# Patient Record
Sex: Female | Born: 1990 | Race: Black or African American | Hispanic: No | Marital: Single | Smoking: Never smoker
Health system: Southern US, Community
[De-identification: ages and names within clinical notes are randomized; demographics above are authoritative.]

---
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
## 2013-05-23 ENCOUNTER — Other Ambulatory Visit: Payer: Self-pay | Admitting: Family Medicine

## 2013-05-23 ENCOUNTER — Ambulatory Visit: Payer: Self-pay | Admitting: Family Medicine

## 2013-05-23 ENCOUNTER — Telehealth: Payer: Self-pay | Admitting: Radiology

## 2013-05-23 VITALS — BP 108/64 | HR 57 | Temp 98.6°F | Resp 16 | Ht 61.5 in | Wt 169.0 lb

## 2013-05-23 DIAGNOSIS — N6320 Unspecified lump in the left breast, unspecified quadrant: Secondary | ICD-10-CM

## 2013-05-23 DIAGNOSIS — N63 Unspecified lump in unspecified breast: Secondary | ICD-10-CM

## 2013-05-23 DIAGNOSIS — R928 Other abnormal and inconclusive findings on diagnostic imaging of breast: Secondary | ICD-10-CM

## 2013-05-23 NOTE — Progress Notes (Signed)
Patient ID: Yvonne Castro, female   DOB: 12-24-1990, 22 y.o.   MRN: [id number]  @[facility] @  Patient ID: Yvonne Castro MRN: [id number], DOB: 08-Sep-1990, 22 y.o. Date of Encounter: 05/23/2013, 11:41 AM This chart was scribed for Elvina Sidle, MD by Valera Castle, ED Scribe. This patient was seen in room 8 and the patient's care was started at 11:41 AM.  Primary Physician: No primary provider on file.  Chief Complaint: Breast Biopsy referral  HPI: 22 y.o. year old female with history below presents for a breast biopsy referral. She reports her doctor told her she needed a referral before she could have the biopsy performed. She reports first being diagnosed with a breast lump, fibroadenoma, during a military exam on 05/02/2013, and that she needs to have the biopsy performed before she can continue with the [employer]. She denies breast cancer running in her family. She denies any other complaints.  She states she went to [employer].   No past medical history on file.   Home Meds: Prior to Admission medications   Not on File    Allergies: No Known Allergies  History   Social History  . Marital Status: Single    Spouse Name: N/A    Number of Children: N/A  . Years of Education: N/A   Occupational History  . Not on file.   Social History Main Topics  . Smoking status: Never Smoker   . Smokeless tobacco: Not on file  . Alcohol Use: No  . Drug Use: No  . Sexual Activity: Not on file   Other Topics Concern  . Not on file   Social History Narrative  . No narrative on file     Review of Systems: Constitutional: negative for chills, fever, night sweats, weight changes, or fatigue  HEENT: negative for vision changes, hearing loss, congestion, rhinorrhea, ST, epistaxis, or sinus pressure Cardiovascular: negative for chest pain or palpitations Respiratory: negative for hemoptysis, wheezing, shortness of breath, or cough Abdominal: negative for abdominal pain, nausea, vomiting,  diarrhea, or constipation Dermatological: negative for rash  Positive for lump over right breast Neurologic: negative for headache, dizziness, or syncope All other systems reviewed and are otherwise negative with the exception to those above and in the HPI.   Physical Exam: Blood pressure 108/64, pulse 57, temperature 98.6 F (37 C), temperature source Oral, resp. rate 16, height 5' 1.5" (1.562 m), weight 169 lb (76.658 kg), last menstrual period 05/01/2013, SpO2 100.00%., Body mass index is 31.42 kg/(m^2). General: Well developed, well nourished, in no acute distress. Head: Normocephalic, atraumatic, eyes without discharge, sclera non-icteric, nares are without discharge. Bilateral auditory canals clear, TM's are without perforation, pearly grey and translucent with reflective cone of light bilaterally. Oral cavity moist, posterior pharynx without exudate, erythema, peritonsillar abscess, or post nasal drip.  Neck: Supple. No thyromegaly. Full ROM. No lymphadenopathy. Lungs: Clear bilaterally to auscultation without wheezes, rales, or rhonchi. Breathing is unlabored. Heart: RRR with S1 S2. No murmurs, rubs, or gallops appreciated. Abdomen: Soft, non-tender, non-distended with normoactive bowel sounds. No hepatomegaly. No rebound/guarding. No obvious abdominal masses. Msk:  Strength and tone normal for age. Extremities/Skin: Warm and dry. No clubbing or cyanosis. No edema. No rashes or suspicious lesions.  Neuro: Alert and oriented X 3. Moves all extremities spontaneously. Gait is normal. CNII-XII grossly in tact. Psych:  Responds to questions appropriately with a normal affect.    ASSESSMENT AND PLAN:  22 y.o. 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 The recorded information has been reviewed and is accurate.

## 2013-05-23 NOTE — Telephone Encounter (Signed)
Patient went with written order for this, it must be in Epic, have put this in for you, please ask for help in the future, I will be happy to help. Amy The breast center is in epic, all there orders must be in computer.

## 2013-05-23 NOTE — Telephone Encounter (Signed)
Call from the breast center, patient has written order for a biopsy. Unsure why, have transferred to [facility], Production designer, theatre/television/film.

## 2013-05-25 NOTE — Telephone Encounter (Signed)
Put in order correctly. Will discuss with French Ana. We can not send patients with written Rx to breast center.

## 2013-05-26 ENCOUNTER — Ambulatory Visit
Admission: RE | Admit: 2013-05-26 | Discharge: 2013-05-26 | Disposition: A | Discharge status: Home / Self Care | Payer: Medicaid Other | Source: Ambulatory Visit | Attending: Family Medicine | Admitting: Family Medicine

## 2013-05-26 ENCOUNTER — Other Ambulatory Visit: Payer: Self-pay | Admitting: Family Medicine

## 2013-05-26 DIAGNOSIS — N6320 Unspecified lump in the left breast, unspecified quadrant: Secondary | ICD-10-CM

## 2013-08-10 ENCOUNTER — Encounter (HOSPITAL_COMMUNITY): Payer: Self-pay | Admitting: Emergency Medicine

## 2013-08-10 ENCOUNTER — Encounter (HOSPITAL_COMMUNITY): Admission: EM | Disposition: A | Discharge status: Home / Self Care | Payer: Self-pay | Source: Home / Self Care

## 2013-08-10 ENCOUNTER — Emergency Department (INDEPENDENT_AMBULATORY_CARE_PROVIDER_SITE_OTHER)
Admission: EM | Admit: 2013-08-10 | Discharge: 2013-08-10 | Disposition: A | Discharge status: Home / Self Care | Payer: Self-pay | Source: Home / Self Care | Attending: Emergency Medicine | Admitting: Emergency Medicine

## 2013-08-10 ENCOUNTER — Emergency Department (INDEPENDENT_AMBULATORY_CARE_PROVIDER_SITE_OTHER): Payer: Self-pay

## 2013-08-10 ENCOUNTER — Emergency Department (HOSPITAL_COMMUNITY): Payer: Medicaid Other

## 2013-08-10 ENCOUNTER — Ambulatory Visit (HOSPITAL_COMMUNITY)
Admission: EM | Admit: 2013-08-10 | Discharge: 2013-08-11 | Disposition: A | Discharge status: Home / Self Care | Payer: Medicaid Other | Attending: Orthopedic Surgery | Admitting: Orthopedic Surgery

## 2013-08-10 ENCOUNTER — Emergency Department (HOSPITAL_COMMUNITY): Payer: Medicaid Other | Admitting: Anesthesiology

## 2013-08-10 ENCOUNTER — Encounter (HOSPITAL_COMMUNITY): Payer: Medicaid Other | Admitting: Anesthesiology

## 2013-08-10 DIAGNOSIS — S52609A Unspecified fracture of lower end of unspecified ulna, initial encounter for closed fracture: Principal | ICD-10-CM

## 2013-08-10 DIAGNOSIS — S52501A Unspecified fracture of the lower end of right radius, initial encounter for closed fracture: Secondary | ICD-10-CM | POA: Diagnosis present

## 2013-08-10 DIAGNOSIS — W19XXXA Unspecified fall, initial encounter: Secondary | ICD-10-CM | POA: Insufficient documentation

## 2013-08-10 DIAGNOSIS — S52509A Unspecified fracture of the lower end of unspecified radius, initial encounter for closed fracture: Secondary | ICD-10-CM

## 2013-08-10 DIAGNOSIS — S52309A Unspecified fracture of shaft of unspecified radius, initial encounter for closed fracture: Secondary | ICD-10-CM | POA: Insufficient documentation

## 2013-08-10 DIAGNOSIS — Y9361 Activity, american tackle football: Secondary | ICD-10-CM | POA: Insufficient documentation

## 2013-08-10 HISTORY — PX: ORIF WRIST FRACTURE: SHX2133

## 2013-08-10 LAB — CBC
HCT: 34.6 % — ABNORMAL LOW (ref 36.0–46.0)
HEMOGLOBIN: 11.2 g/dL — AB (ref 12.0–15.0)
MCH: 26.5 pg (ref 26.0–34.0)
MCHC: 32.4 g/dL (ref 30.0–36.0)
MCV: 81.8 fL (ref 78.0–100.0)
Platelets: 291 10*3/uL (ref 150–400)
RBC: 4.23 MIL/uL (ref 3.87–5.11)
RDW: 15.2 % (ref 11.5–15.5)
WBC: 12.2 10*3/uL — ABNORMAL HIGH (ref 4.0–10.5)

## 2013-08-10 LAB — BASIC METABOLIC PANEL
BUN: 12 mg/dL (ref 6–23)
CALCIUM: 9.3 mg/dL (ref 8.4–10.5)
CO2: 24 mEq/L (ref 19–32)
Chloride: 104 mEq/L (ref 96–112)
Creatinine, Ser: 0.62 mg/dL (ref 0.50–1.10)
GFR calc Af Amer: >90 mL/min (ref >90)
Glucose, Bld: 82 mg/dL (ref 70–99)
Potassium: 3.9 mEq/L (ref 3.7–5.3)
Sodium: 140 mEq/L (ref 137–147)

## 2013-08-10 LAB — POCT PREGNANCY, URINE: Preg Test, Ur: NEGATIVE

## 2013-08-10 SURGERY — OPEN REDUCTION INTERNAL FIXATION (ORIF) WRIST FRACTURE
Anesthesia: General | Site: Wrist | Laterality: Right

## 2013-08-10 MED ORDER — CEFAZOLIN SODIUM 1 G IJ SOLR
2.0000 g | INTRAMUSCULAR | Status: AC
  Administered 2013-08-10: 2 g via INTRAVENOUS
  Filled 2013-08-10 (×2): qty 20

## 2013-08-10 MED ORDER — LIDOCAINE HCL (CARDIAC) 20 MG/ML IV SOLN
INTRAVENOUS | Status: DC | PRN
  Administered 2013-08-10: 80 mg via INTRAVENOUS

## 2013-08-10 MED ORDER — ADULT MULTIVITAMIN W/MINERALS CH
1.0000 | ORAL_TABLET | Freq: Every day | ORAL | Status: DC
  Administered 2013-08-11: 1 via ORAL
  Filled 2013-08-10: qty 1

## 2013-08-10 MED ORDER — DOCUSATE SODIUM 100 MG PO CAPS: 100.0000 mg | ORAL_CAPSULE | Freq: Two times a day (BID) | ORAL | Status: AC

## 2013-08-10 MED ORDER — METHOCARBAMOL 500 MG PO TABS
ORAL_TABLET | ORAL | Status: AC
  Administered 2013-08-10: 500 mg via ORAL
  Filled 2013-08-10: qty 1

## 2013-08-10 MED ORDER — HYDROMORPHONE HCL PF 1 MG/ML IJ SOLN
INTRAMUSCULAR | Status: AC
  Administered 2013-08-10: 0.5 mg via INTRAVENOUS
  Filled 2013-08-10: qty 1

## 2013-08-10 MED ORDER — BUPIVACAINE HCL (PF) 0.25 % IJ SOLN
INTRAMUSCULAR | Status: AC
  Filled 2013-08-10: qty 30

## 2013-08-10 MED ORDER — HYDROMORPHONE HCL PF 1 MG/ML IJ SOLN
0.2500 mg | INTRAMUSCULAR | Status: DC | PRN
  Administered 2013-08-10 (×2): 0.5 mg via INTRAVENOUS

## 2013-08-10 MED ORDER — DEXAMETHASONE SODIUM PHOSPHATE 4 MG/ML IJ SOLN
INTRAMUSCULAR | Status: AC
  Filled 2013-08-10: qty 1

## 2013-08-10 MED ORDER — CEFAZOLIN SODIUM 1-5 GM-% IV SOLN
1.0000 g | INTRAVENOUS | Status: AC
  Administered 2013-08-10: 1 g via INTRAVENOUS
  Filled 2013-08-10: qty 50

## 2013-08-10 MED ORDER — OXYCODONE HCL 5 MG/5ML PO SOLN: 5.0000 mg | Freq: Once | ORAL | Status: DC | PRN

## 2013-08-10 MED ORDER — ALPRAZOLAM 0.5 MG PO TABS: 0.5000 mg | ORAL_TABLET | Freq: Four times a day (QID) | ORAL | Status: DC | PRN

## 2013-08-10 MED ORDER — VITAMIN C 500 MG PO TABS: 500.0000 mg | ORAL_TABLET | Freq: Every day | ORAL | Status: AC

## 2013-08-10 MED ORDER — 0.9 % SODIUM CHLORIDE (POUR BTL) OPTIME
TOPICAL | Status: DC | PRN
  Administered 2013-08-10 (×2): 1000 mL

## 2013-08-10 MED ORDER — HYDROMORPHONE HCL PF 1 MG/ML IJ SOLN: 0.5000 mg | INTRAMUSCULAR | Status: DC | PRN

## 2013-08-10 MED ORDER — MIDAZOLAM HCL 5 MG/5ML IJ SOLN
INTRAMUSCULAR | Status: DC | PRN
  Administered 2013-08-10: 1 mg via INTRAVENOUS

## 2013-08-10 MED ORDER — HYDROMORPHONE HCL PF 1 MG/ML IJ SOLN: 0.2500 mg | INTRAMUSCULAR | Status: DC | PRN

## 2013-08-10 MED ORDER — METHOCARBAMOL 100 MG/ML IJ SOLN
500.0000 mg | Freq: Four times a day (QID) | INTRAVENOUS | Status: DC | PRN
  Filled 2013-08-10: qty 5

## 2013-08-10 MED ORDER — HYDROCODONE-ACETAMINOPHEN 5-325 MG PO TABS
ORAL_TABLET | ORAL | Status: AC
  Filled 2013-08-10: qty 1

## 2013-08-10 MED ORDER — DIPHENHYDRAMINE HCL 25 MG PO CAPS: 25.0000 mg | ORAL_CAPSULE | Freq: Four times a day (QID) | ORAL | Status: DC | PRN

## 2013-08-10 MED ORDER — ONDANSETRON HCL 4 MG/2ML IJ SOLN
INTRAMUSCULAR | Status: DC | PRN
  Administered 2013-08-10: 4 mg via INTRAVENOUS

## 2013-08-10 MED ORDER — METHOCARBAMOL 500 MG PO TABS
500.0000 mg | ORAL_TABLET | Freq: Four times a day (QID) | ORAL | Status: DC | PRN
  Administered 2013-08-10 – 2013-08-11 (×2): 500 mg via ORAL
  Filled 2013-08-10: qty 1

## 2013-08-10 MED ORDER — DEXAMETHASONE SODIUM PHOSPHATE 4 MG/ML IJ SOLN
INTRAMUSCULAR | Status: DC | PRN
  Administered 2013-08-10: 4 mg via INTRAVENOUS

## 2013-08-10 MED ORDER — FENTANYL CITRATE 0.05 MG/ML IJ SOLN
INTRAMUSCULAR | Status: AC
  Filled 2013-08-10: qty 5

## 2013-08-10 MED ORDER — MEPERIDINE HCL 25 MG/ML IJ SOLN: 6.2500 mg | INTRAMUSCULAR | Status: DC | PRN

## 2013-08-10 MED ORDER — ONDANSETRON HCL 4 MG PO TABS: 4.0000 mg | ORAL_TABLET | Freq: Four times a day (QID) | ORAL | Status: DC | PRN

## 2013-08-10 MED ORDER — OXYCODONE-ACETAMINOPHEN 10-325 MG PO TABS: 1.0000 | ORAL_TABLET | ORAL | Status: AC | PRN

## 2013-08-10 MED ORDER — OXYCODONE-ACETAMINOPHEN 5-325 MG PO TABS
ORAL_TABLET | ORAL | Status: AC
  Administered 2013-08-10: 2 via ORAL
  Filled 2013-08-10: qty 2

## 2013-08-10 MED ORDER — ONDANSETRON HCL 4 MG/2ML IJ SOLN
INTRAMUSCULAR | Status: AC
  Filled 2013-08-10: qty 2

## 2013-08-10 MED ORDER — DOCUSATE SODIUM 100 MG PO CAPS
100.0000 mg | ORAL_CAPSULE | Freq: Two times a day (BID) | ORAL | Status: DC
  Administered 2013-08-11: 100 mg via ORAL
  Filled 2013-08-10: qty 1

## 2013-08-10 MED ORDER — OXYCODONE HCL 5 MG PO TABS
5.0000 mg | ORAL_TABLET | Freq: Once | ORAL | Status: DC | PRN
Start: 2013-08-10 — End: 2013-08-10

## 2013-08-10 MED ORDER — MIDAZOLAM HCL 2 MG/2ML IJ SOLN
0.5000 mg | Freq: Once | INTRAMUSCULAR | Status: AC | PRN
  Administered 2013-08-10: 0.5 mg via INTRAVENOUS

## 2013-08-10 MED ORDER — MIDAZOLAM HCL 2 MG/2ML IJ SOLN
INTRAMUSCULAR | Status: AC
  Administered 2013-08-10: 0.5 mg via INTRAVENOUS
  Filled 2013-08-10: qty 2

## 2013-08-10 MED ORDER — OXYCODONE-ACETAMINOPHEN 5-325 MG PO TABS
1.0000 | ORAL_TABLET | ORAL | Status: DC | PRN
  Administered 2013-08-10: 2 via ORAL

## 2013-08-10 MED ORDER — LACTATED RINGERS IV SOLN
INTRAVENOUS | Status: DC
Start: 2013-08-10 — End: 2013-08-11
  Administered 2013-08-10: 17:00:00 via INTRAVENOUS

## 2013-08-10 MED ORDER — PROMETHAZINE HCL 25 MG/ML IJ SOLN: 6.2500 mg | INTRAMUSCULAR | Status: DC | PRN

## 2013-08-10 MED ORDER — ONDANSETRON HCL 4 MG/2ML IJ SOLN: 4.0000 mg | Freq: Four times a day (QID) | INTRAMUSCULAR | Status: DC | PRN

## 2013-08-10 MED ORDER — KCL IN DEXTROSE-NACL 20-5-0.9 MEQ/L-%-% IV SOLN
INTRAVENOUS | Status: DC
  Administered 2013-08-10: via INTRAVENOUS
  Filled 2013-08-10 (×2): qty 1000

## 2013-08-10 MED ORDER — CHLORHEXIDINE GLUCONATE 4 % EX LIQD
60.0000 mL | Freq: Once | CUTANEOUS | Status: DC
  Filled 2013-08-10 (×2): qty 60

## 2013-08-10 MED ORDER — CEFAZOLIN SODIUM 1-5 GM-% IV SOLN
1.0000 g | Freq: Three times a day (TID) | INTRAVENOUS | Status: DC
  Administered 2013-08-11 (×2): 1 g via INTRAVENOUS
  Filled 2013-08-10 (×4): qty 50

## 2013-08-10 MED ORDER — VITAMIN C 500 MG PO TABS
1000.0000 mg | ORAL_TABLET | Freq: Every day | ORAL | Status: DC
  Administered 2013-08-11: 1000 mg via ORAL
  Filled 2013-08-10: qty 2

## 2013-08-10 MED ORDER — LACTATED RINGERS IV SOLN
INTRAVENOUS | Status: DC | PRN
  Administered 2013-08-10 (×2): via INTRAVENOUS

## 2013-08-10 MED ORDER — MIDAZOLAM HCL 2 MG/2ML IJ SOLN
INTRAMUSCULAR | Status: AC
  Filled 2013-08-10: qty 2

## 2013-08-10 MED ORDER — FENTANYL CITRATE 0.05 MG/ML IJ SOLN
INTRAMUSCULAR | Status: DC | PRN
  Administered 2013-08-10: 25 ug via INTRAVENOUS
  Administered 2013-08-10: 50 ug via INTRAVENOUS
  Administered 2013-08-10: 25 ug via INTRAVENOUS
  Administered 2013-08-10: 50 ug via INTRAVENOUS
  Administered 2013-08-10: 25 ug via INTRAVENOUS
  Administered 2013-08-10: 50 ug via INTRAVENOUS
  Administered 2013-08-10: 25 ug via INTRAVENOUS

## 2013-08-10 MED ORDER — HYDROCODONE-ACETAMINOPHEN 5-325 MG PO TABS
1.0000 | ORAL_TABLET | ORAL | Status: DC | PRN
  Administered 2013-08-10: 2 via ORAL
  Administered 2013-08-11: 1 via ORAL
  Filled 2013-08-10: qty 1
  Filled 2013-08-10: qty 2

## 2013-08-10 MED ORDER — HYDROCODONE-ACETAMINOPHEN 5-325 MG PO TABS
1.0000 | ORAL_TABLET | Freq: Once | ORAL | Status: AC
  Administered 2013-08-10: 1 via ORAL

## 2013-08-10 MED ORDER — OXYCODONE HCL 5 MG PO TABS: 5.0000 mg | ORAL_TABLET | Freq: Once | ORAL | Status: DC | PRN

## 2013-08-10 MED ORDER — PROPOFOL 10 MG/ML IV BOLUS
INTRAVENOUS | Status: AC
  Filled 2013-08-10: qty 20

## 2013-08-10 MED ORDER — PROPOFOL 10 MG/ML IV BOLUS
INTRAVENOUS | Status: DC | PRN
  Administered 2013-08-10: 180 mg via INTRAVENOUS

## 2013-08-10 SURGICAL SUPPLY — 50 items
BANDAGE ELASTIC 3 VELCRO ST LF (GAUZE/BANDAGES/DRESSINGS) ×3 IMPLANT
BANDAGE ELASTIC 4 VELCRO ST LF (GAUZE/BANDAGES/DRESSINGS) ×3 IMPLANT
BANDAGE GAUZE ELAST BULKY 4 IN (GAUZE/BANDAGES/DRESSINGS) ×3 IMPLANT
BLADE SURG ROTATE 9660 (MISCELLANEOUS) IMPLANT
BNDG ESMARK 4X9 LF (GAUZE/BANDAGES/DRESSINGS) ×3 IMPLANT
BNDG GAUZE ELAST 4 BULKY (GAUZE/BANDAGES/DRESSINGS) ×3 IMPLANT
CLOTH BEACON ORANGE TIMEOUT ST (SAFETY) ×3 IMPLANT
CORDS BIPOLAR (ELECTRODE) ×3 IMPLANT
COVER SURGICAL LIGHT HANDLE (MISCELLANEOUS) ×3 IMPLANT
CUFF TOURNIQUET SINGLE 18IN (TOURNIQUET CUFF) ×3 IMPLANT
CUFF TOURNIQUET SINGLE 24IN (TOURNIQUET CUFF) IMPLANT
DRAIN TLS ROUND 10FR (DRAIN) IMPLANT
DRAPE OEC MINIVIEW 54X84 (DRAPES) ×3 IMPLANT
DRAPE SURG 17X11 SM STRL (DRAPES) ×3 IMPLANT
DRSG ADAPTIC 3X8 NADH LF (GAUZE/BANDAGES/DRESSINGS) ×3 IMPLANT
ELECT REM PT RETURN 9FT ADLT (ELECTROSURGICAL)
ELECTRODE REM PT RTRN 9FT ADLT (ELECTROSURGICAL) IMPLANT
GLOVE BIO SURGEON STRL SZ 6.5 (GLOVE) ×2 IMPLANT
GLOVE BIO SURGEONS STRL SZ 6.5 (GLOVE) ×1
GLOVE BIOGEL PI IND STRL 8.5 (GLOVE) ×1 IMPLANT
GLOVE BIOGEL PI INDICATOR 8.5 (GLOVE) ×2
GLOVE SURG ORTHO 8.0 STRL STRW (GLOVE) ×3 IMPLANT
GOWN EXTRA PROTECTION XL (GOWNS) ×3 IMPLANT
GOWN PREVENTION PLUS XLARGE (GOWN DISPOSABLE) ×3 IMPLANT
GOWN STRL NON-REIN LRG LVL3 (GOWN DISPOSABLE) ×9 IMPLANT
KIT BASIN OR (CUSTOM PROCEDURE TRAY) ×3 IMPLANT
KIT ROOM TURNOVER OR (KITS) ×3 IMPLANT
MANIFOLD NEPTUNE II (INSTRUMENTS) ×3 IMPLANT
NEEDLE HYPO 25X1 1.5 SAFETY (NEEDLE) ×3 IMPLANT
NS IRRIG 1000ML POUR BTL (IV SOLUTION) ×3 IMPLANT
PACK ORTHO EXTREMITY (CUSTOM PROCEDURE TRAY) ×3 IMPLANT
PAD ARMBOARD 7.5X6 YLW CONV (MISCELLANEOUS) ×6 IMPLANT
PAD CAST 4YDX4 CTTN HI CHSV (CAST SUPPLIES) ×1 IMPLANT
PADDING CAST COTTON 4X4 STRL (CAST SUPPLIES) ×2
PLATE DVR E-PAK NAR LCKED R (Orthopedic Implant) ×3 IMPLANT
PLATE DVR TEMPLATE SET (INSTRUMENTS) ×3 IMPLANT
SOAP 2 % CHG 4 OZ (WOUND CARE) ×3 IMPLANT
SPLINT FIBERGLASS 3X35 (CAST SUPPLIES) ×3 IMPLANT
SPONGE GAUZE 4X4 12PLY (GAUZE/BANDAGES/DRESSINGS) ×3 IMPLANT
SUT PROLENE 4 0 PS 2 18 (SUTURE) ×3 IMPLANT
SUT VIC AB 2-0 FS1 27 (SUTURE) ×3 IMPLANT
SUT VICRYL 4-0 PS2 18IN ABS (SUTURE) ×3 IMPLANT
SUT VICRYL RAPIDE 4/0 PS 2 (SUTURE) ×6 IMPLANT
SYR CONTROL 10ML LL (SYRINGE) IMPLANT
SYSTEM CHEST DRAIN TLS 7FR (DRAIN) IMPLANT
TOWEL OR 17X24 6PK STRL BLUE (TOWEL DISPOSABLE) ×3 IMPLANT
TOWEL OR 17X26 10 PK STRL BLUE (TOWEL DISPOSABLE) ×3 IMPLANT
TUBE CONNECTING 12'X1/4 (SUCTIONS) ×1
TUBE CONNECTING 12X1/4 (SUCTIONS) ×2 IMPLANT
WATER STERILE IRR 1000ML POUR (IV SOLUTION) ×3 IMPLANT

## 2013-08-10 NOTE — Discharge Instructions (Signed)
KEEP BANDAGE [facility]AN AND [facility]Y CALL OFFICE FOR F/U APPT [phone] IN 14 DAYS KEEP HAND ELEVATED ABOVE HEART OK TO APPLY ICE TO OPERATIVE AREA CONTACT OFFICE IF ANY WORSENING PAIN OR CONCERNS. 

## 2013-08-10 NOTE — Discharge Instructions (Signed)
Your x-rays show that you have broken the radius and the ulna bones in your wrist. You will need to see Dr. Melvyn [facility] now.

## 2013-08-10 NOTE — ED Notes (Signed)
Fell yesterday hurt rt wrist went to ucc and was found to have fx wrist here for surgery

## 2013-08-10 NOTE — ED Provider Notes (Signed)
CSN: [id number]     Arrival date & time 08/10/13  0816 History   First MD Initiated Contact with Patient 08/10/13 [phone]     Chief Complaint  Patient presents with  . Wrist Injury   (Consider location/radiation/quality/duration/timing/severity/associated sxs/prior Treatment) Patient is a 23 y.o. female presenting with wrist injury. The history is provided by the patient.  Wrist Injury Location:  Wrist and arm Time since incident:  1 day Injury: yes   Mechanism of injury comment:  Playing football Arm location:  R arm Wrist location:  R wrist Chronicity:  New Handedness:  Right-handed Dislocation: no   Foreign body present:  No foreign bodies Prior injury to area:  No Relieved by:  Nothing Ineffective treatments:  Ice  Yvonne Castro is a 23 y.o. female who presents to the UC with right wrist pain. She was playing football and running and tripped and fell on her right wrist. She complains of swelling and pain to the wrist and forearm. She applied ice and elevated after the injury. This morning the pain and swelling is worse. She has taken nothing for pain. She denies any other injures.   No past medical history on file. No past surgical history on file. No family history on file. History  Substance Use Topics  . Smoking status: Never Smoker   . Smokeless tobacco: Not on file  . Alcohol Use: No   OB History   Grav Para Term Preterm Abortions TAB SAB Ect Mult Living                 Review of Systems Negative except as stated in HPI  Allergies  Review of patient's allergies indicates no known allergies.  Home Medications  No current outpatient prescriptions on file. BP 133/78  Pulse 67  Temp(Src) 99 F (37.2 C) (Oral)  Resp 16  SpO2 100% Physical Exam  Nursing note and vitals reviewed. Constitutional: She is oriented to person, place, and time. She appears well-developed and well-nourished. No distress.  HENT:  Head: Normocephalic and atraumatic.  Eyes: EOM are  normal.  Neck: Neck supple.  Cardiovascular: Normal rate.   Pulmonary/Chest: Effort normal.  Musculoskeletal:       Right wrist: She exhibits decreased range of motion, tenderness, bony tenderness, swelling and deformity. She exhibits no laceration.  Radial pulses equal bilateral, adequate circulation. Right wrist and forearm with swelling and tenderness. Slightly decreased sensation over radial aspect of right wrist at area of most swelling.   Neurological: She is alert and oriented to person, place, and time. No cranial nerve deficit.  Skin: Skin is warm and dry.  Psychiatric: She has a normal mood and affect. Her behavior is normal.    ED Course  Procedures  Dg Forearm Right  08/10/2013   CLINICAL DATA:  Status post fall and wrist pain  EXAM: RIGHT FOREARM - 2 VIEW  COMPARISON:  None.  FINDINGS: There is a comminuted transverse distal right radial metaphysis fracture without significant displacement and minimal apex volar angulation. There is a nondisplaced fracture of the base of the ulnar styloid process. There is surrounding soft tissue swelling. There is no dislocation. There is no radiopaque foreign body.  IMPRESSION: 1. Mildly comminuted transverse distal right radial metaphysis fracture. 2. Nondisplaced fracture at the base of the ulnar styloid process.   Electronically Signed   By: Elige Ko   On: 08/10/2013 10:26   Dg Wrist Complete Right  08/10/2013   CLINICAL DATA:  Right wrist injury status post  fall  EXAM: RIGHT WRIST - COMPLETE 3+ VIEW  COMPARISON:  None.  FINDINGS: There is a mildly comminuted transverse fracture of the distal right radial metaphysis without significant angulation or displacement. There is a nondisplaced fracture at the base of the ulnar styloid process. There is surrounding soft tissue swelling.  IMPRESSION: 1. Mildly comminuted transverse fracture of the distal right radial metaphysis without significant angulation or displacement. 2. Nondisplaced fracture of  the base of the ulnar styloid process.   Electronically Signed   By: Elige KoHetal  Patel   On: 08/10/2013 10:38    MDM: Call to Dr. Melvyn Novasrtmann on call for hand @ 11:10 am  MD returned call @ 12:20 pm [facility]. Patient to remain NPO.  Stable for transfer to the ED via shuttle, she remains neurovascularly intact. Splint applied for comfort.  23 y.o. female with comminuted fracture of the distal radial metaphysis and fracture of the ulnar styloid s/p injury yesterday.    Janne NapoleonHope M Osmani Kersten, [place]NP 08/10/13 1227

## 2013-08-10 NOTE — ED Notes (Signed)
Pt c/o right wrist/arm inj onset yest around 1730 while playing football Reports she slipped on wet grass and fell Does not recall the landing; "happened to fast" Denies head inj/LOC Right arm swelling and tender... Increases w/activity Has been applying ice Alert w/no signs of acute distress.

## 2013-08-10 NOTE — Addendum Note (Signed)
Addendum created 08/10/13 2257 by Pricilla HolmMary Z Josiane Labine, CRNA   Modules edited: Anesthesia Flowsheet

## 2013-08-10 NOTE — Anesthesia Procedure Notes (Signed)
Procedure Name: LMA Insertion Date/Time: 08/10/2013 6:17 PM Performed by: Gayla MedicusHYPES, Raja Caputi M. Pre-anesthesia Checklist: Patient identified, Timeout performed, Emergency Drugs available, Suction available and Patient being monitored Patient Re-evaluated:Patient Re-evaluated prior to inductionOxygen Delivery Method: Circle system utilized Preoxygenation: Pre-oxygenation with 100% oxygen Intubation Type: IV induction LMA: LMA inserted LMA Size: 4.0 Number of attempts: 1 Placement Confirmation: positive ETCO2 and breath sounds checked- equal and bilateral Tube secured with: Tape Dental Injury: Teeth and Oropharynx as per pre-operative assessment

## 2013-08-10 NOTE — H&P (Signed)
Yvonne Castro is an 23 y.o. female.   Chief Complaint: right wrist fracture HPI: pt fell on right wrist playing football yesterday sustaining closed distal radius fracture Pt with comminuted distal radius fracture and here for surgery to correct alignment of wrist No prior surgery to right wrist RHD  History reviewed. No pertinent past medical history.  History reviewed. No pertinent past surgical history.  No family history on file. Social History:  reports that she has never smoked. She does not have any smokeless tobacco history on file. She reports that she does not drink alcohol or use illicit drugs.  Allergies:  Allergies  Allergen Reactions  . Blueberry [Vaccinium Angustifolium] Other (See Comments)    Reaction unknown  . Kiwi Extract Other (See Comments)    Reaction unknown  . Pineapple Other (See Comments)    Reaction unknown     (Not in a hospital admission)  Results for orders placed during the hospital encounter of 08/10/13 (from the past 48 hour(s))  CBC     Status: Abnormal   Collection Time    08/10/13  1:45 PM      Result Value Range   WBC 12.2 (*) 4.0 - 10.5 K/uL   RBC 4.23  3.87 - 5.11 MIL/uL   Hemoglobin 11.2 (*) 12.0 - 15.0 g/dL   HCT 34.6 (*) 36.0 - 46.0 %   MCV 81.8  78.0 - 100.0 fL   MCH 26.5  26.0 - 34.0 pg   MCHC 32.4  30.0 - 36.0 g/dL   RDW 15.2  11.5 - 15.5 %   Platelets 291  150 - 400 K/uL  BASIC METABOLIC PANEL     Status: None   Collection Time    08/10/13  1:45 PM      Result Value Range   Sodium 140  137 - 147 mEq/L   Potassium 3.9  3.7 - 5.3 mEq/L   Chloride 104  96 - 112 mEq/L   CO2 24  19 - 32 mEq/L   Glucose, Bld 82  70 - 99 mg/dL   BUN 12  6 - 23 mg/dL   Creatinine, Ser 0.62  0.50 - 1.10 mg/dL   Calcium 9.3  8.4 - 10.5 mg/dL   GFR calc non Af Amer >90  >90 mL/min   GFR calc Af Amer >90  >90 mL/min   Comment: (NOTE)     The eGFR has been calculated using the CKD EPI equation.     This calculation has not been validated in  all clinical situations.     eGFR's persistently <90 mL/min signify possible Chronic Kidney     Disease.  POCT PREGNANCY, URINE     Status: None   Collection Time    08/10/13  3:03 PM      Result Value Range   Preg Test, Ur NEGATIVE  NEGATIVE   Comment:            THE SENSITIVITY OF THIS     METHODOLOGY IS >24 mIU/mL   Dg Forearm Right  08/10/2013   CLINICAL DATA:  Status post fall and wrist pain  EXAM: RIGHT FOREARM - 2 VIEW  COMPARISON:  None.  FINDINGS: There is a comminuted transverse distal right radial metaphysis fracture without significant displacement and minimal apex volar angulation. There is a nondisplaced fracture of the base of the ulnar styloid process. There is surrounding soft tissue swelling. There is no dislocation. There is no radiopaque foreign body.  IMPRESSION: 1. Mildly comminuted transverse  distal right radial metaphysis fracture. 2. Nondisplaced fracture at the base of the ulnar styloid process.   Electronically Signed   By: Kathreen Devoid   On: 08/10/2013 10:26   Dg Wrist Complete Right  08/10/2013   CLINICAL DATA:  Right wrist injury status post fall  EXAM: RIGHT WRIST - COMPLETE 3+ VIEW  COMPARISON:  None.  FINDINGS: There is a mildly comminuted transverse fracture of the distal right radial metaphysis without significant angulation or displacement. There is a nondisplaced fracture at the base of the ulnar styloid process. There is surrounding soft tissue swelling.  IMPRESSION: 1. Mildly comminuted transverse fracture of the distal right radial metaphysis without significant angulation or displacement. 2. Nondisplaced fracture of the base of the ulnar styloid process.   Electronically Signed   By: Kathreen Devoid   On: 08/10/2013 10:38    ROS NO RECENT ILLNESSES OR HOSPITALIZATIONS  Blood pressure 122/83, pulse 53, temperature 98.6 F (37 C), temperature source Oral, resp. rate 20, height 5' 1"  (1.549 m), weight 77.338 kg (170 lb 8 oz), last menstrual period 07/27/2013,  SpO2 100.00%. Physical Exam  General Appearance:  Alert, cooperative, no distress, appears stated age  Head:  Normocephalic, without obvious abnormality, atraumatic  Eyes:  Pupils equal, conjunctiva/corneas clear,         Throat: Lips, mucosa, and tongue normal; teeth and gums normal  Neck: No visible masses     Lungs:   respirations unlabored  Chest Wall:  No tenderness or deformity  Heart:  Regular rate and rhythm,  Abdomen:   Soft, non-tender,         Extremities: RIGHT WRIST: SKIN INTACT FINGERS WARM WELL PERFUSED. ABLE TO WIGGLE FINGERS FINGERS WARM WELL PERFUSED ABLE TO EXTEND THUMB  Pulses: 2+ and symmetric  Skin: Skin color, texture, turgor normal, no rashes or lesions     Neurologic: Normal    Assessment/Plan RIGHT WRIST COMMINUTED INTRAARTICULAR DISTAL RADIUS FRACTURE, DISPLACED, CLOSED  RIGHT DISTAL RADIUS OPEN REDUCTION AND INTERNAL FIXATION AND REPAIR AS INDICATED  R/B/A DISCUSSED WITH PT IN HOSPITAL/FAMILY AT BEDSIDE.  PT VOICED UNDERSTANDING OF PLAN CONSENT SIGNED DAY OF SURGERY PT SEEN AND EXAMINED PRIOR TO OPERATIVE PROCEDURE/DAY OF SURGERY SITE MARKED. QUESTIONS ANSWERED WILL REMAIN OVERNIGHT OBSERVATION FOLLOWING SURGERY  Linna Hoff 08/10/2013, 3:29 PM

## 2013-08-10 NOTE — Preoperative (Signed)
Beta Blockers   Reason not to administer Beta Blockers:Not Applicable 

## 2013-08-10 NOTE — Progress Notes (Signed)
Report given to Laura, RN call light within reach 

## 2013-08-10 NOTE — ED Provider Notes (Signed)
MSE was initiated and I personally evaluated the patient and placed orders (if any) at  2:17 PM on August 10, 2013.  Pt stable, distal pulses intact on right UE with known distal radius fx Will be seen by dr Melvyn Novasortmann for admission    Joya Gaskinsonald W Payzlee Ryder, MD 08/10/13 [phone]

## 2013-08-10 NOTE — Transfer of Care (Signed)
Immediate Anesthesia Transfer of Care Note  Patient: Yvonne Castro  Procedure(s) Performed: Procedure(s): OPEN REDUCTION INTERNAL FIXATION (ORIF) WRIST FRACTURE/RIGHT (Right)  Patient Location: PACU  Anesthesia Type:General  Level of Consciousness: awake, alert  and oriented  Airway & Oxygen Therapy: Patient Spontanous Breathing and Patient connected to nasal cannula oxygen  Post-op Assessment: Report given to PACU RN and Post -op Vital signs reviewed and stable  Post vital signs: Reviewed and stable  Complications: No apparent anesthesia complications

## 2013-08-10 NOTE — ED Provider Notes (Signed)
Medical screening examination/treatment/procedure(s) were performed by non-physician practitioner and as supervising physician I was immediately available for consultation/collaboration.  Leslee Homeavid Karesa Maultsby, M.D.   Reuben Likesavid C Catalino Plascencia, MD 08/10/13 2139

## 2013-08-10 NOTE — Anesthesia Postprocedure Evaluation (Signed)
Anesthesia Post Note  Patient: Yvonne Castro  Procedure(s) Performed: Procedure(s) (LRB): OPEN REDUCTION INTERNAL FIXATION (ORIF) WRIST FRACTURE/RIGHT (Right)  Anesthesia type: General  Patient location: PACU  Post pain: Pain level controlled and Adequate analgesia  Post assessment: Post-op Vital signs reviewed, Patient's Cardiovascular Status Stable, Respiratory Function Stable, Patent Airway and Pain level controlled  Last Vitals:  Filed Vitals:   08/10/13 1555  BP: 128/79  Pulse: 69  Temp: 37.1 C  Resp: 20    Post vital signs: Reviewed and stable  Level of consciousness: awake, alert  and oriented  Complications: No apparent anesthesia complications

## 2013-08-10 NOTE — Brief Op Note (Signed)
08/10/2013  3:32 PM  PATIENT:  Yvonne Castro  22 y.o. female  PRE-OPERATIVE DIAGNOSIS:  Right Wrist Fx  POST-OPERATIVE DIAGNOSIS:  SAME  PROCEDURE:  Procedure(s): OPEN REDUCTION INTERNAL FIXATION (ORIF) WRIST FRACTURE/RIGHT (Right)  SURGEON:  Surgeon(s) and Role:    * Sharma CovertFred W Ahnaf Caponi, MD - Primary  PHYSICIAN ASSISTANT:   ASSISTANTS: none   ANESTHESIA:   general  EBL:     BLOOD ADMINISTERED:none  DRAINS: none   LOCAL MEDICATIONS USED:  MARCAINE     SPECIMEN:  No Specimen  DISPOSITION OF SPECIMEN:  N/A  COUNTS:  YES  TOURNIQUET:  * No tourniquets in log *  DICTATION: .[id number]  PLAN OF CARE: Admit for overnight observation  PATIENT DISPOSITION:  PACU - hemodynamically stable.   Delay start of Pharmacological VTE agent (>24hrs) due to surgical blood loss or risk of bleeding: not applicable

## 2013-08-10 NOTE — Anesthesia Preprocedure Evaluation (Signed)
Anesthesia Evaluation  Patient identified by MRN, date of birth, ID band Patient awake    Reviewed: Allergy & Precautions, H&P , NPO status , Patient's Chart, lab work & pertinent test results  History of Anesthesia Complications Negative for: history of anesthetic complications  Airway Mallampati: I TM Distance: >3 FB Neck ROM: Full    Dental  (+) Teeth Intact and Dental Advisory Given   Pulmonary neg pulmonary ROS,  breath sounds clear to auscultation  Pulmonary exam normal       Cardiovascular negative cardio ROS  Rhythm:Regular Rate:Normal     Neuro/Psych negative neurological ROS     GI/Hepatic negative GI ROS, Neg liver ROS,   Endo/Other  Morbid obesity  Renal/GU negative Renal ROS     Musculoskeletal   Abdominal (+) + obese,   Peds  Hematology   Anesthesia Other Findings   Reproductive/Obstetrics LMP 2 weeks ago, preg test NEG                           Anesthesia Physical Anesthesia Plan  ASA: II  Anesthesia Plan: General   Post-op Pain Management:    Induction: Intravenous  Airway Management Planned: LMA  Additional Equipment:   Intra-op Plan:   Post-operative Plan:   Informed Consent: I have reviewed the patients History and Physical, chart, labs and discussed the procedure including the risks, benefits and alternatives for the proposed anesthesia with the patient or authorized representative who has indicated his/her understanding and acceptance.   Dental advisory given  Plan Discussed with: CRNA and Surgeon  Anesthesia Plan Comments: (Patient refuses regional anesthesia, plan routine monitors, GA- LMA OK)        Anesthesia Quick Evaluation

## 2013-08-10 NOTE — ED Notes (Signed)
HAVE SPOKEN TO XRAY 2 TIMES ABOUT GETTING PT CHEST XRAY DONE. HAVE PUT ORDER IN 2 TIMES AND THEY STATE THE CANNOT SEE THE ORDER. OR HAS CALLED FOR PT AND XRAY AGREEABLE TO DO CHEST XRAY NOW SINCE OR IS READY FOR PT. PT FAMILY HAS HER BELONGINGS AND ARE FOLLOWING PT TO XRAY THEN THE OR

## 2013-08-11 NOTE — Progress Notes (Signed)
Patient discharged to home with instructions. 

## 2013-08-11 NOTE — Discharge Summary (Signed)
Physician Discharge Summary  Patient ID: Yvonne Castro MRN: [id number] DOB/AGE: 09/10/1990 23 y.o.  Admit date: 08/10/2013 Discharge date: 08/11/2013  Admission Diagnoses: Right Wrist Fx History reviewed. No pertinent past medical history.  Discharge Diagnoses:  Active Problems:   Closed fracture of right distal radius   Surgeries: Procedure(s): OPEN REDUCTION INTERNAL FIXATION (ORIF) WRIST FRACTURE/RIGHT on 08/10/2013    Consultants:    Discharged Condition: Improved  Hospital Course: Yvonne MasterJessica Bargo is an [age] y.o. female who was admitted 08/10/2013 with a chief complaint of  Chief Complaint  Patient presents with  . Wrist Pain  , and found to have a diagnosis of Right Wrist Fx.  They were brought to the operating room on 08/10/2013 and underwent Procedure(s): OPEN REDUCTION INTERNAL FIXATION (ORIF) WRIST FRACTURE/RIGHT.    They were given perioperative antibiotics: Anti-infectives   Start     Dose/Rate Route Frequency Ordered Stop   08/11/13 0800  ceFAZolin (ANCEF) IVPB 1 g/50 mL premix     1 g 100 mL/hr over 30 Minutes Intravenous 3 times per day 08/10/13 2228     08/11/13 0000  ceFAZolin (ANCEF) IVPB 1 g/50 mL premix     1 g 100 mL/hr over 30 Minutes Intravenous NOW 08/10/13 2228 08/11/13 0018   08/10/13 1400  ceFAZolin (ANCEF) 2 g in dextrose 5 % 50 mL IVPB     2 g 140 mL/hr over 30 Minutes Intravenous On call to O.R. 08/10/13 1344 08/10/13 1820    .  They were given sequential compression devices, early ambulation, and Other (comment)AMBULATION for DVT prophylaxis.  Recent vital signs: Patient Vitals for the past 24 hrs:  BP Temp Temp src Pulse Resp SpO2  08/11/13 0514 118/68 mmHg 98.3 F (36.8 C) Oral 49 20 100 %  08/10/13 2216 128/89 mmHg 99.2 F (37.3 C) Oral 47 20 100 %  08/10/13 2145 130/79 mmHg - - 63 18 100 %  08/10/13 2130 140/88 mmHg - - 70 14 100 %  08/10/13 2115 128/82 mmHg - - 73 20 100 %  08/10/13 2100 134/81 mmHg - - 76 14 100 %  08/10/13 2045 130/86 mmHg  - - 68 16 100 %  08/10/13 2030 130/85 mmHg - - 54 14 100 %  08/10/13 2015 131/83 mmHg - - 55 11 100 %  08/10/13 2000 140/91 mmHg - - 55 14 100 %  08/10/13 1945 128/94 mmHg - - 68 16 100 %  08/10/13 1937 - 97.9 F (36.6 C) - 91 20 100 %  08/10/13 1555 128/79 mmHg 98.8 F (37.1 C) Oral 69 20 100 %  08/10/13 1334 122/83 mmHg 98.6 F (37 C) Oral 53 20 100 %  .  Recent laboratory studies: Dg Chest 2 View  08/10/2013   CLINICAL DATA:  Preoperative evaluation  EXAM: CHEST  2 VIEW  COMPARISON:  None.  FINDINGS: Lungs are clear. Heart size and pulmonary vascularity are normal. No adenopathy. There is thoracolumbar levoscoliosis.  IMPRESSION: No edema or consolidation.   Electronically Signed   By: Bretta BangWilliam  Woodruff M.D.   On: 08/10/2013 16:07   Dg Forearm Right  08/10/2013   CLINICAL DATA:  Status post fall and wrist pain  EXAM: RIGHT FOREARM - 2 VIEW  COMPARISON:  None.  FINDINGS: There is a comminuted transverse distal right radial metaphysis fracture without significant displacement and minimal apex volar angulation. There is a nondisplaced fracture of the base of the ulnar styloid process. There is surrounding soft tissue swelling. There is no dislocation. There  is no radiopaque foreign body.  IMPRESSION: 1. Mildly comminuted transverse distal right radial metaphysis fracture. 2. Nondisplaced fracture at the base of the ulnar styloid process.   Electronically Signed   By: Elige Ko   On: 08/10/2013 10:26   Dg Wrist Complete Right  08/10/2013   CLINICAL DATA:  Right wrist injury status post fall  EXAM: RIGHT WRIST - COMPLETE 3+ VIEW  COMPARISON:  None.  FINDINGS: There is a mildly comminuted transverse fracture of the distal right radial metaphysis without significant angulation or displacement. There is a nondisplaced fracture at the base of the ulnar styloid process. There is surrounding soft tissue swelling.  IMPRESSION: 1. Mildly comminuted transverse fracture of the distal right radial metaphysis  without significant angulation or displacement. 2. Nondisplaced fracture of the base of the ulnar styloid process.   Electronically Signed   By: Elige Ko   On: 08/10/2013 10:38    Discharge Medications:     Medication List         docusate sodium 100 MG capsule  Commonly known as:  COLACE  Take 1 capsule (100 mg total) by mouth 2 (two) times daily.     oxyCODONE-acetaminophen 10-325 MG per tablet  Commonly known as:  PERCOCET  Take 1 tablet by mouth every 4 (four) hours as needed for pain.     vitamin C 500 MG tablet  Commonly known as:  ASCORBIC ACID  Take 1 tablet (500 mg total) by mouth daily.        Diagnostic Studies: Dg Chest 2 View  08/10/2013   CLINICAL DATA:  Preoperative evaluation  EXAM: CHEST  2 VIEW  COMPARISON:  None.  FINDINGS: Lungs are clear. Heart size and pulmonary vascularity are normal. No adenopathy. There is thoracolumbar levoscoliosis.  IMPRESSION: No edema or consolidation.   Electronically Signed   By: Bretta Bang M.D.   On: 08/10/2013 16:07   Dg Forearm Right  08/10/2013   CLINICAL DATA:  Status post fall and wrist pain  EXAM: RIGHT FOREARM - 2 VIEW  COMPARISON:  None.  FINDINGS: There is a comminuted transverse distal right radial metaphysis fracture without significant displacement and minimal apex volar angulation. There is a nondisplaced fracture of the base of the ulnar styloid process. There is surrounding soft tissue swelling. There is no dislocation. There is no radiopaque foreign body.  IMPRESSION: 1. Mildly comminuted transverse distal right radial metaphysis fracture. 2. Nondisplaced fracture at the base of the ulnar styloid process.   Electronically Signed   By: Elige Ko   On: 08/10/2013 10:26   Dg Wrist Complete Right  08/10/2013   CLINICAL DATA:  Right wrist injury status post fall  EXAM: RIGHT WRIST - COMPLETE 3+ VIEW  COMPARISON:  None.  FINDINGS: There is a mildly comminuted transverse fracture of the distal right radial metaphysis  without significant angulation or displacement. There is a nondisplaced fracture at the base of the ulnar styloid process. There is surrounding soft tissue swelling.  IMPRESSION: 1. Mildly comminuted transverse fracture of the distal right radial metaphysis without significant angulation or displacement. 2. Nondisplaced fracture of the base of the ulnar styloid process.   Electronically Signed   By: Elige Ko   On: 08/10/2013 10:38    They benefited maximally from their hospital stay and there were no complications.     Disposition: 01-Home or Self Care      Follow-up Information   Follow up with Sharma Covert, MD In 2 weeks.  Specialty:  Orthopedic Surgery   Contact information:   [address] [place] [address] [phone]        Signed: Sharma Covert 08/11/2013, 1:26 PM

## 2013-08-12 NOTE — Op Note (Signed)
NAMBoone Master:  Castro, Yvonne               ACCOUNT NO.:  [id number]  MEDICAL RECORD NO.:  [id number]  LOCATION:                                 FACILITY:  PHYSICIAN:  Madelynn DoneFred W Meleny Tregoning IV, MD  DATE OF BIRTH:  Feb 22, 1991  DATE OF PROCEDURE:  08/10/2013 DATE OF DISCHARGE:  08/11/2013                              OPERATIVE REPORT   PREOPERATIVE DIAGNOSIS:  Right wrist intra-articular distal radius fracture of two or more fragments.  POSTOPERATIVE DIAGNOSIS:  Right wrist intra-articular distal radius fracture of two or more fragments.  ATTENDING PHYSICIAN:  Madelynn DoneFred W Lianah Peed IV, MD who scrubbed and present for the entire procedure.  ASSISTANT SURGEON:  None.  ANESTHESIA:  General via LMA.  SURGICAL PROCEDURE: 1. Right wrist open treatment of intra-articular distal radius     fracture of two or more fragments and internal fixation. 2. Right wrist brachioradialis tendon release and lengthening. 3. Radiographs 3 views, right wrist.  SURGICAL IMPLANTS:  Continue cross-lock narrow plate, 6 distal screws, and 5 screws proximally.  SURGICAL INDICATIONS:  Yvonne Castro is a right-hand-dominant female sustained a comminuted intra-articular distal radius fracture.  The patient was seen and evaluated, and given the degree of displacement, recommend she undergo the above procedure.  Risks, benefits, and alternatives were discussed in detail with the patient and signed informed consent was obtained.  Risks include, but not limited to bleeding, infection, damage to nearby nerves, arteries, or tendons, nonunion, malunion, hardware failure, loss of motion of wrist and digits, incomplete relief of symptoms, and need for further surgical intervention.  DESCRIPTION OF PROCEDURE:  The patient was properly identified in the preoperative holding area and marked with a permanent marker made around the right wrist to indicate correct operative site.  The patient was then brought back to the operating room, placed  supine on anesthesia room table where general anesthesia was administered.  The patient tolerated this well.  A well-padded tourniquet was placed on the right brachium and sealed with 1000 drape.  The right upper extremity was then prepped and draped in normal sterile fashion.  Time-out was called. Correct site was identified, and procedure then begun.  Attention then turned to the right wrist where the limb was then elevated using Esmarch exsanguination and tourniquet.  The tourniquet insufflated.  Dissection was carried down through the skin and subcutaneous tissue.  The FCR sheath was then opened proximally and distally.  Going through the floor of the FCR sheath, the FPL was then carefully identified and swept away. An L-shaped pronator quadratus flap was then carried out.  Following this, the fracture site was then exposed.  Intra-articular distal radius fracture of two or more fragments was then identified.  The brachioradialis was carefully released off the radial styloid.  Careful release of the radial styloid was then carried out and lengthening the tendon.  The wound was then thoroughly irrigated.  An open reduction was then performed and volar plate was then applied.  The template was then used to confirm placement in the adequate size of the plate.  The narrow plate was then chosen.  The plate was then held in place with the oblong screw hole proximally.  Plate height was then adjusted.  Distal fixation was carried out from an ulnar to radial direction proximally and then distal column.  Copious wound irrigation done throughout.  Final proximal fixation was then carried out with combination of locking screws.  The wound was then thoroughly irrigated.  Copious wound irrigation done throughout.  The pronator quadratus was then closed with 2-0 Vicryl, subcutaneous tissues closed with 4-0 Vicryl, and the skin was then closed with simple 4-0 Vicryl repeat sutures.  Adaptic  dressing and sterile compressive bandage were then applied.  The patient was then placed a well-padded sugar-tong splint, extubated, and taken to recovery room in good condition.  Intraoperative radiographs; 3 views of the wrist did show the volar plate fixation in place.  There was good position in both planes.  Intraoperative assessment of the SL ligament was then done.  There was no widening of the SL interval.  The patient did have a small ulnar styloid fracture, but no evidence of instability of the distal radioulnar joint after fixation of distal radius.  POSTPROCEDURE PLAN:  The patient was admitted overnight for IV antibiotics and pain control, discharged in the morning, seen back in the office in approximately 2 weeks for wound check.  X-rays, application of short-arm cast, cast immobilization for a total of 4 weeks, x-rays at each visit.     Madelynn Done, MD     FWO/MEDQ  D:  08/10/2013  T:  08/11/2013  Job:  [id number]

## 2013-08-15 ENCOUNTER — Encounter (HOSPITAL_COMMUNITY): Payer: Self-pay | Admitting: Orthopedic Surgery

## 2014-09-04 IMAGING — CR DG WRIST COMPLETE 3+V*R*
2 series · 2 of 2 positions shown · non-contrast
Comparison: None.

CLINICAL DATA: Right wrist injury status post fall

EXAM:
RIGHT WRIST - COMPLETE 3+ VIEW

[view not recorded (1 of 2)]
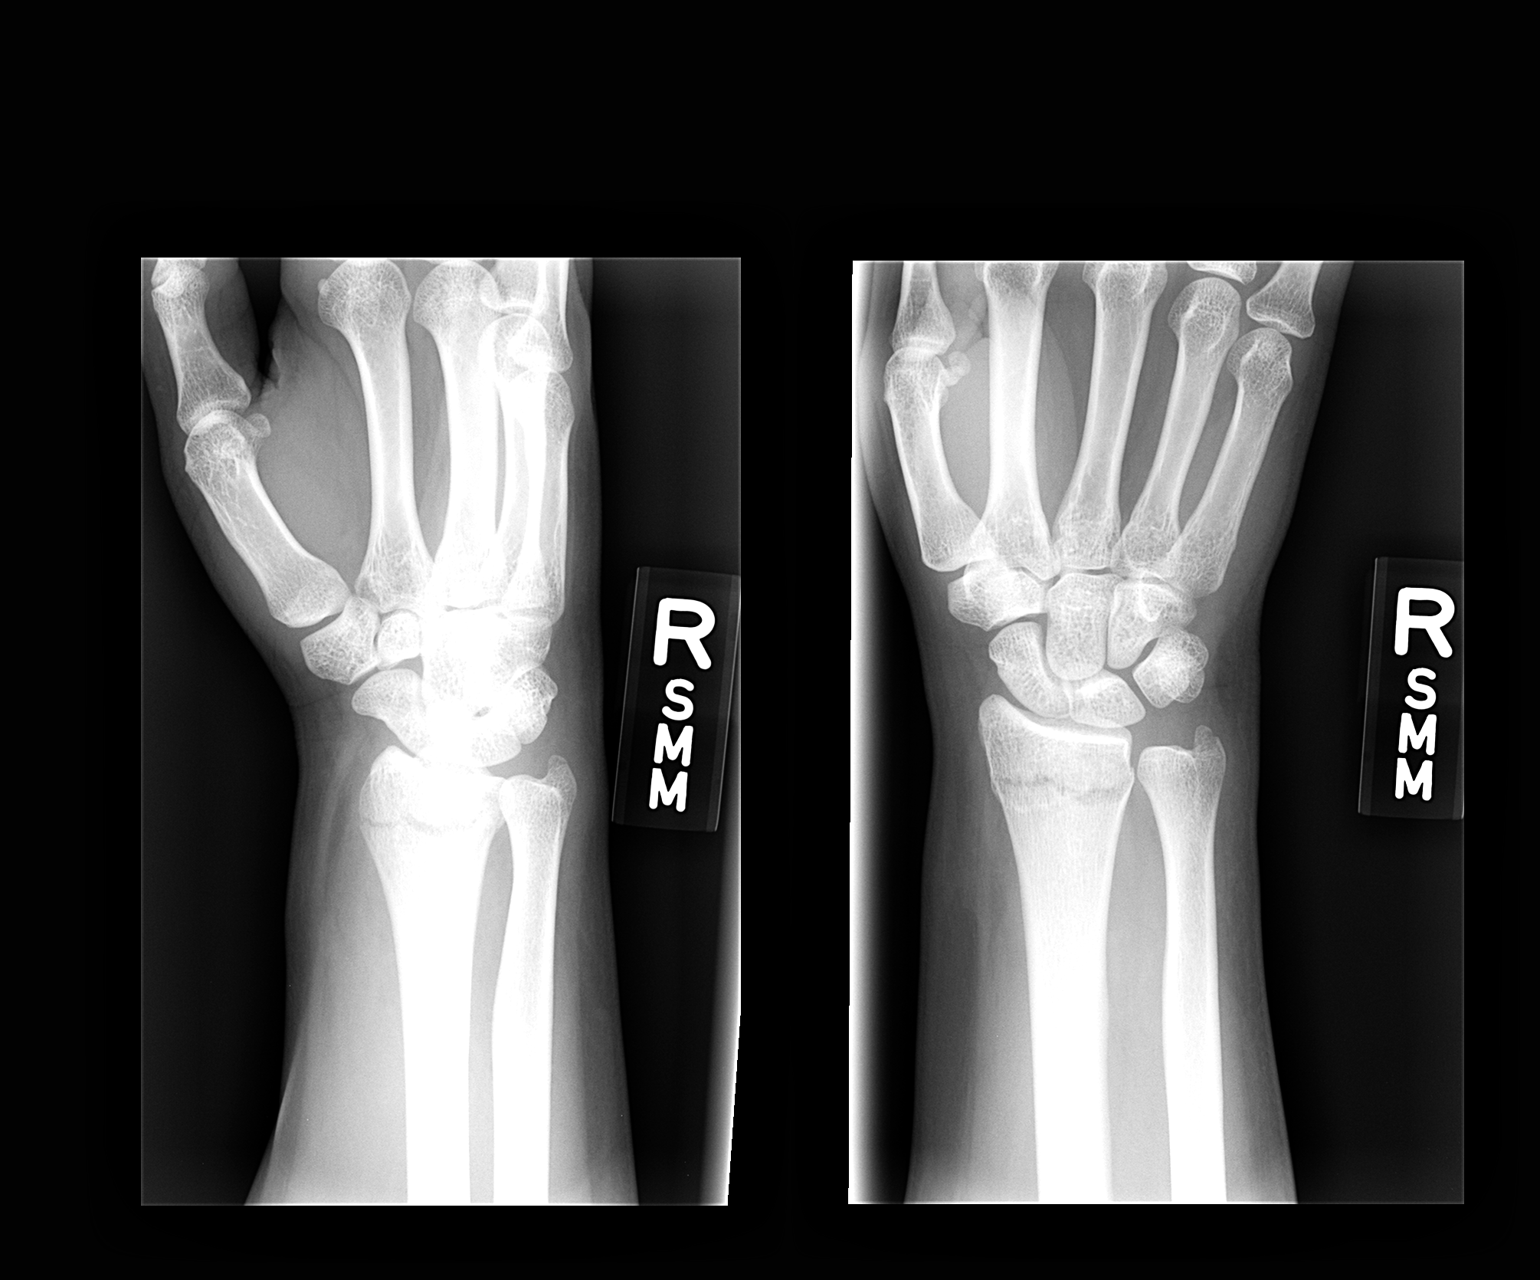

[view not recorded (2 of 2)]
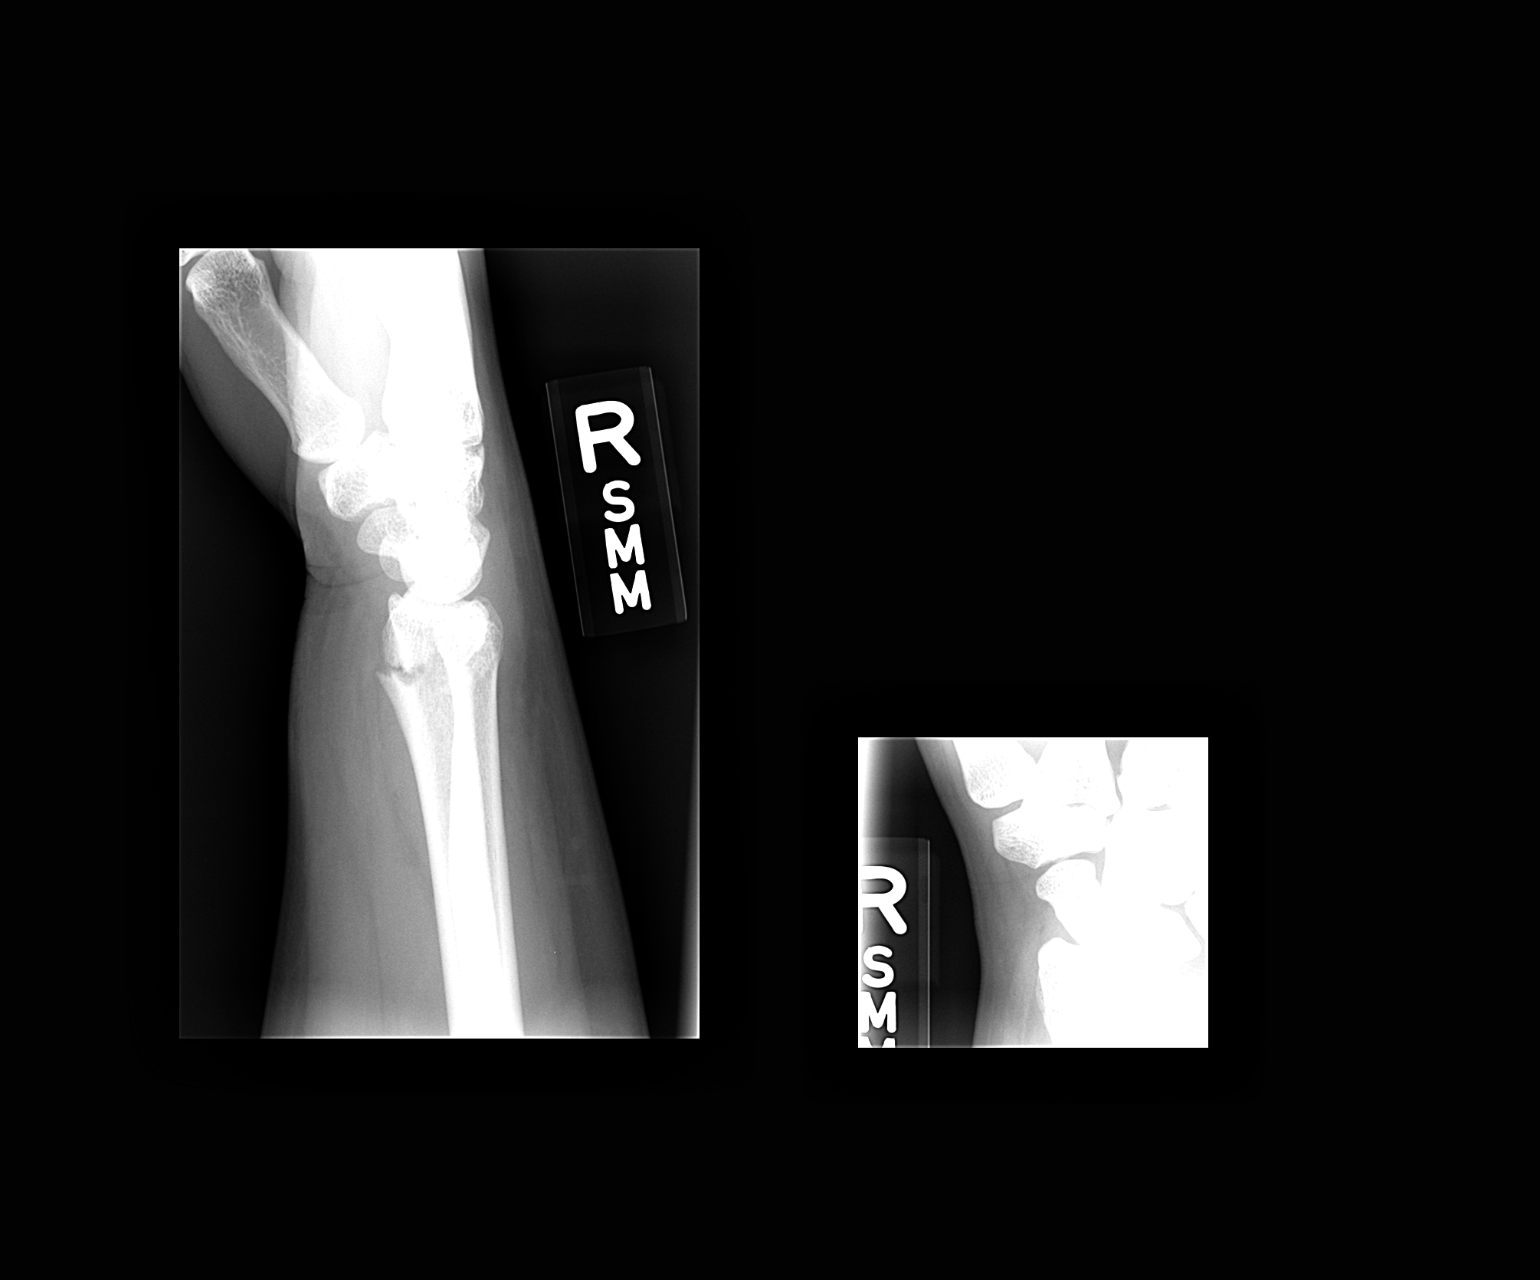

[2 of 2 positions shown; findings below may reference images not displayed]

FINDINGS: There is a mildly comminuted transverse fracture of the distal right
radial metaphysis without significant angulation or displacement.
There is a nondisplaced fracture at the base of the ulnar styloid
process. There is surrounding soft tissue swelling.
IMPRESSION: 1. Mildly comminuted transverse fracture of the distal right radial
metaphysis without significant angulation or displacement.
2. Nondisplaced fracture of the base of the ulnar styloid process.
